# Patient Record
Sex: Male | Born: 2000 | Race: White | Hispanic: No | Marital: Single
Health system: Southern US, Community
[De-identification: ages and names within clinical notes are randomized; demographics above are authoritative.]

## PROBLEM LIST (undated history)

## (undated) DIAGNOSIS — L858 Other specified epidermal thickening: Secondary | ICD-10-CM

## (undated) DIAGNOSIS — T7840XA Allergy, unspecified, initial encounter: Secondary | ICD-10-CM

## (undated) DIAGNOSIS — L309 Dermatitis, unspecified: Secondary | ICD-10-CM

## (undated) DIAGNOSIS — J45909 Unspecified asthma, uncomplicated: Secondary | ICD-10-CM

## (undated) HISTORY — DX: Unspecified asthma, uncomplicated: J45.909

## (undated) HISTORY — DX: Dermatitis, unspecified: L30.9

## (undated) HISTORY — DX: Allergy, unspecified, initial encounter: T78.40XA

## (undated) HISTORY — DX: Other specified epidermal thickening: L85.8

---
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
## 2020-01-18 ENCOUNTER — Other Ambulatory Visit: Payer: Self-pay | Admitting: Pediatrics

## 2020-01-18 DIAGNOSIS — L700 Acne vulgaris: Secondary | ICD-10-CM

## 2020-06-13 ENCOUNTER — Other Ambulatory Visit: Payer: Medicaid Other

## 2020-06-13 ENCOUNTER — Other Ambulatory Visit: Payer: Self-pay

## 2020-06-13 DIAGNOSIS — Z20822 Contact with and (suspected) exposure to covid-19: Secondary | ICD-10-CM | POA: Diagnosis not present

## 2020-06-14 LAB — NOVEL CORONAVIRUS, NAA: SARS-CoV-2, NAA: NOT DETECTED

## 2020-06-14 LAB — SPECIMEN STATUS REPORT

## 2020-06-14 LAB — SARS-COV-2, NAA 2 DAY TAT

## 2020-11-21 IMAGING — DX DG CHEST 2V
2 series · 2 of 2 positions shown · non-contrast
Comparison: Chest radiograph 08/10/2005

CLINICAL DATA: COUGH

EXAM:
CHEST - 2 VIEW

[chest pa]
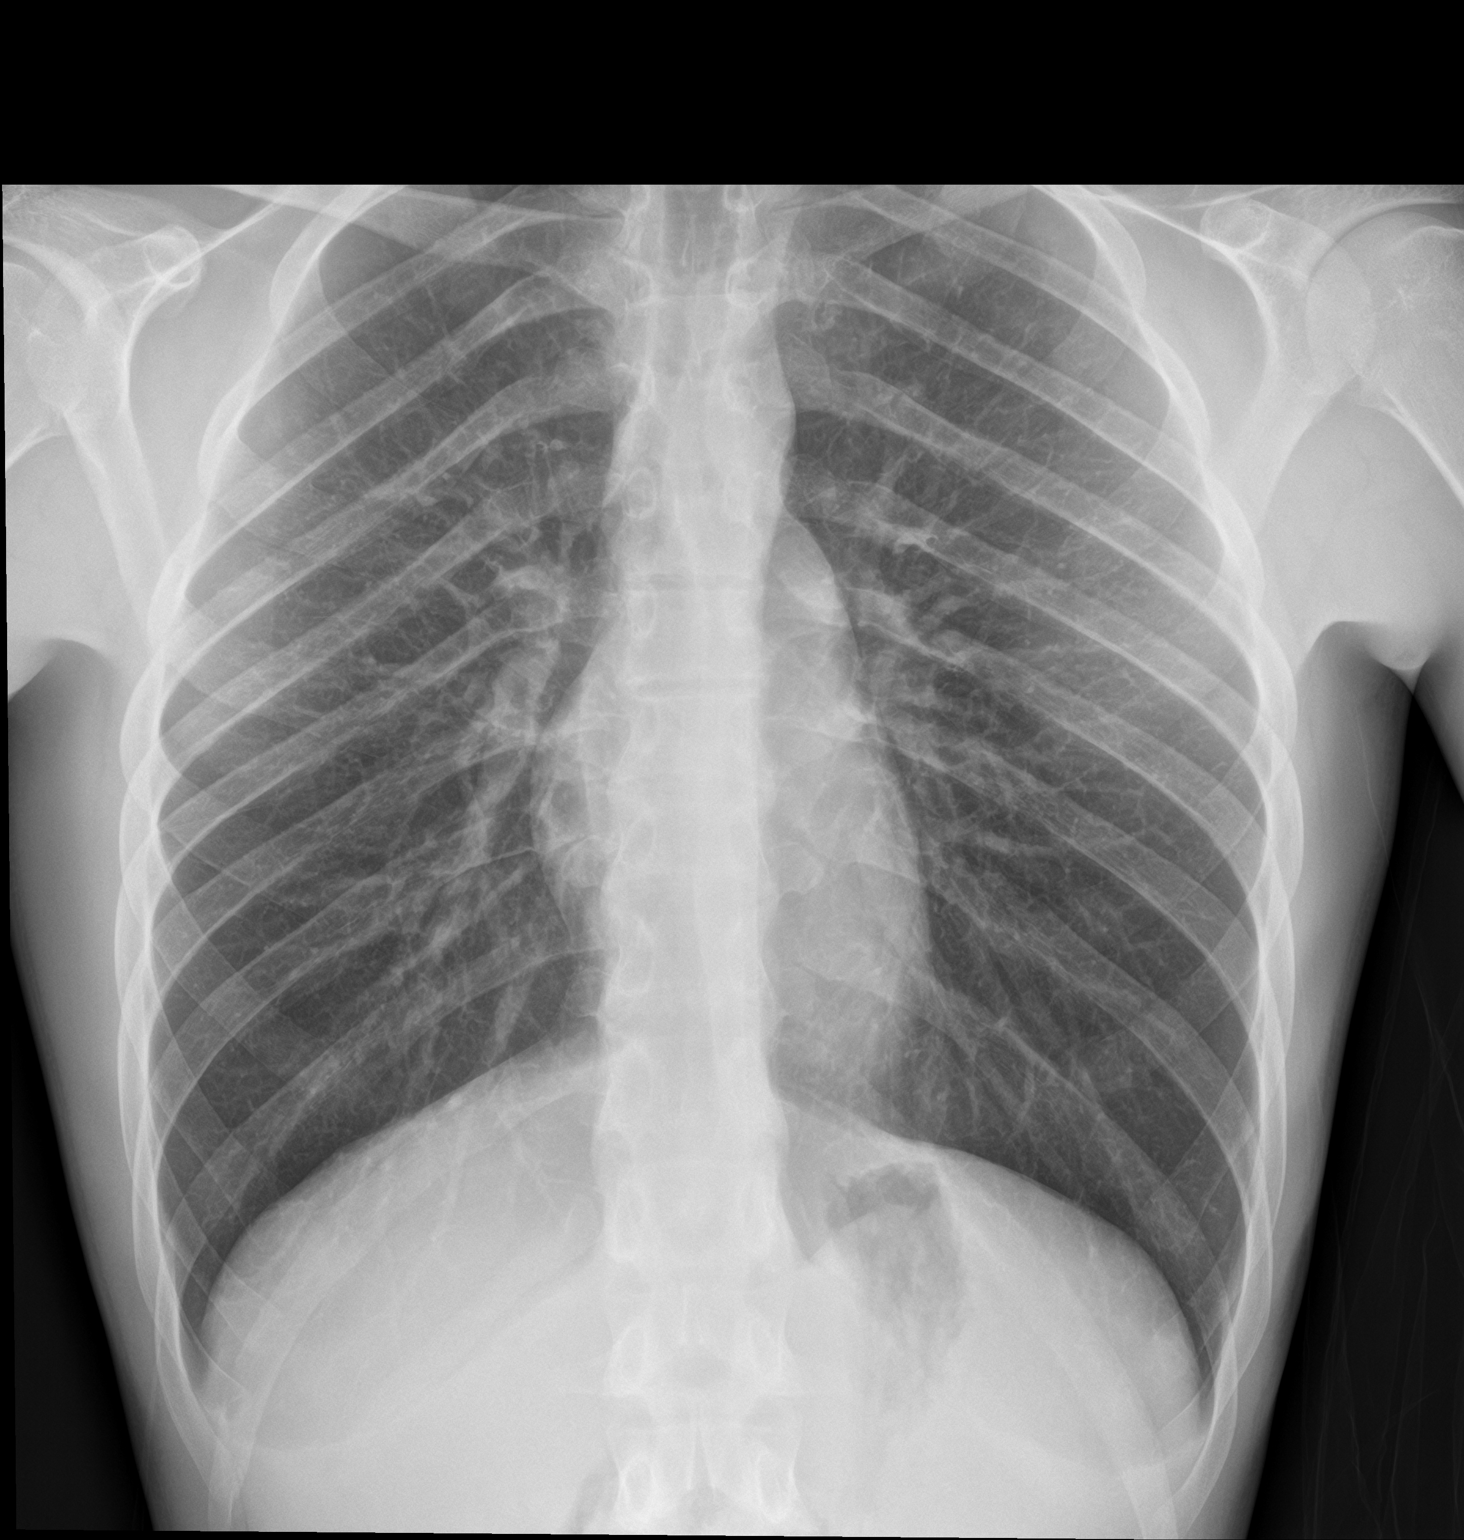

[chest lat]
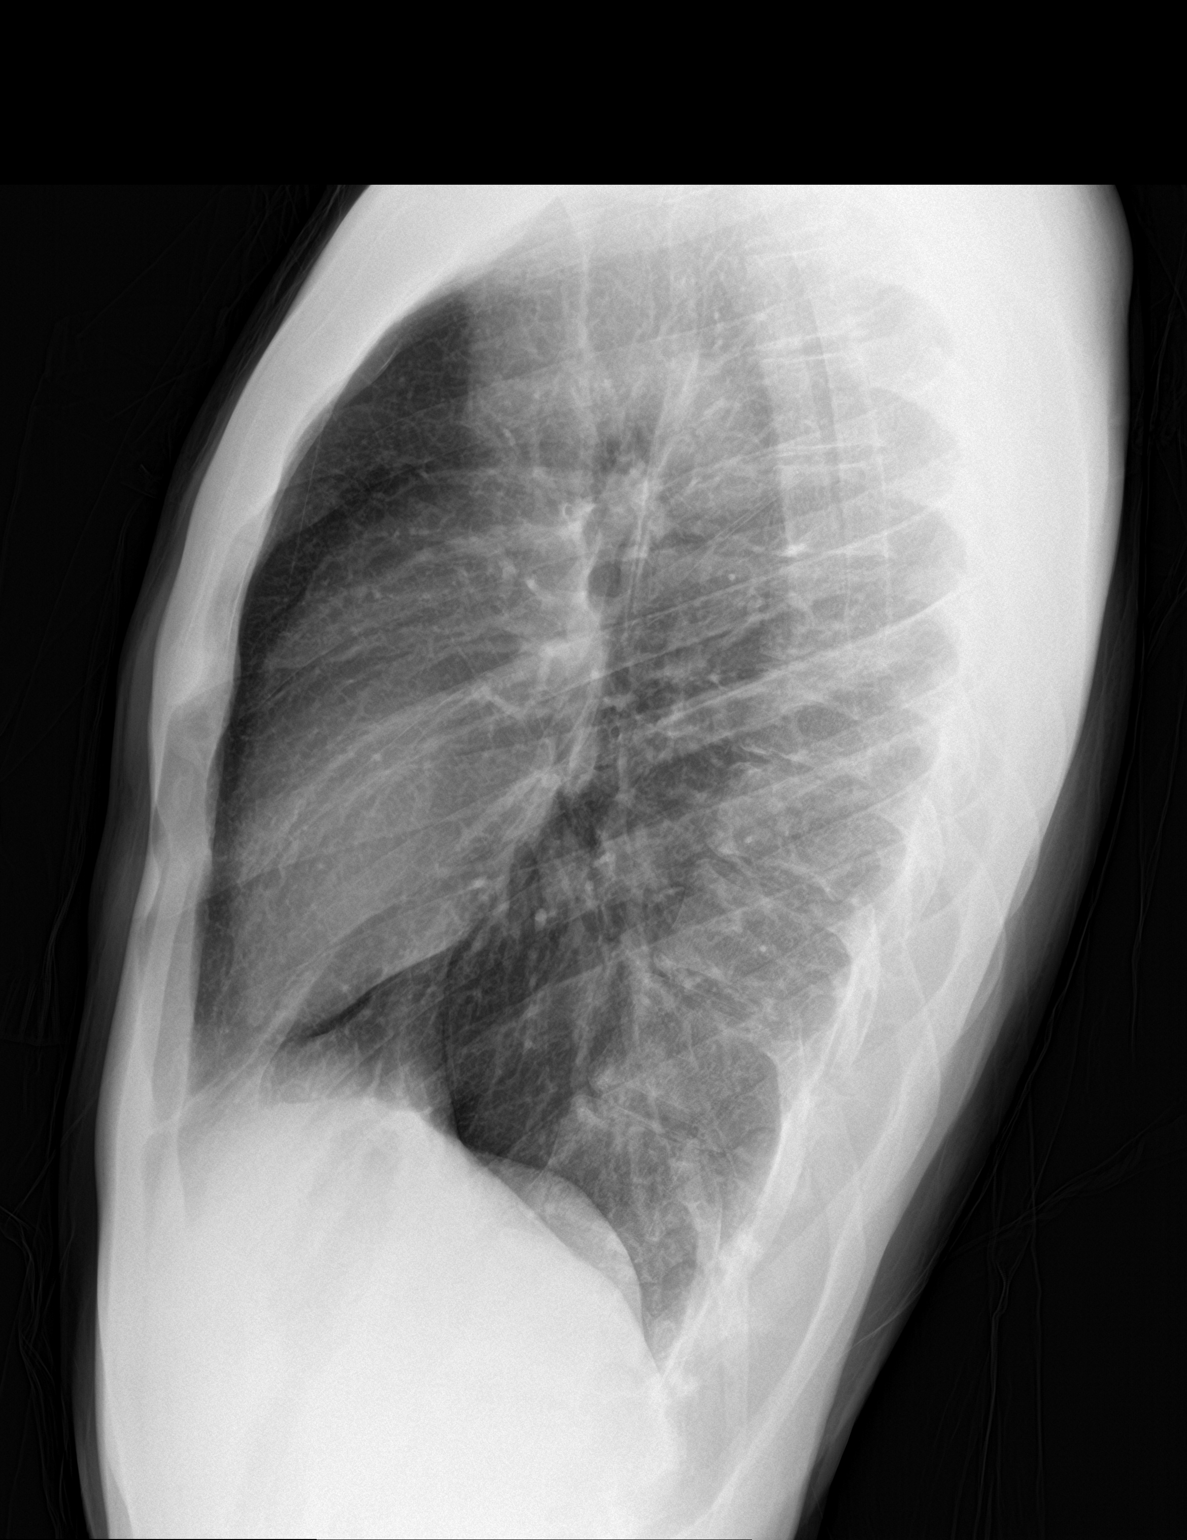

[2 of 2 positions shown; findings below may reference images not displayed]

FINDINGS: The heart size and mediastinal contours are within normal limits.
The lungs are clear. No pneumothorax or pleural effusion. The
visualized skeletal structures are unremarkable.
IMPRESSION: No evidence of active disease in the chest.

## 2021-03-04 ENCOUNTER — Encounter: Payer: Self-pay | Admitting: Pediatrics

## 2021-12-26 ENCOUNTER — Encounter: Payer: Self-pay | Admitting: *Deleted

## 2022-05-19 DIAGNOSIS — R059 Cough, unspecified: Secondary | ICD-10-CM | POA: Diagnosis not present

## 2022-05-19 DIAGNOSIS — J029 Acute pharyngitis, unspecified: Secondary | ICD-10-CM | POA: Diagnosis not present

## 2022-05-19 DIAGNOSIS — Z20822 Contact with and (suspected) exposure to covid-19: Secondary | ICD-10-CM | POA: Diagnosis not present

## 2022-08-22 DIAGNOSIS — J40 Bronchitis, not specified as acute or chronic: Secondary | ICD-10-CM | POA: Diagnosis not present

## 2022-08-22 DIAGNOSIS — Z20822 Contact with and (suspected) exposure to covid-19: Secondary | ICD-10-CM | POA: Diagnosis not present

## 2023-10-05 DIAGNOSIS — K529 Noninfective gastroenteritis and colitis, unspecified: Secondary | ICD-10-CM | POA: Diagnosis not present

## 2023-10-05 DIAGNOSIS — E860 Dehydration: Secondary | ICD-10-CM | POA: Diagnosis not present

## 2023-10-05 DIAGNOSIS — Z20822 Contact with and (suspected) exposure to covid-19: Secondary | ICD-10-CM | POA: Diagnosis not present

## 2023-10-07 ENCOUNTER — Ambulatory Visit
Admission: EM | Admit: 2023-10-07 | Discharge: 2023-10-07 | Disposition: A | Discharge status: Home / Self Care | Payer: Medicaid Other

## 2023-10-07 DIAGNOSIS — R197 Diarrhea, unspecified: Secondary | ICD-10-CM

## 2023-10-07 DIAGNOSIS — R112 Nausea with vomiting, unspecified: Secondary | ICD-10-CM | POA: Diagnosis not present

## 2023-10-07 NOTE — ED Provider Notes (Signed)
[facility]    CSN: [id number] Arrival date & time: 10/07/23  1353      History   Chief Complaint Chief Complaint  Patient presents with   Emesis    HPI Gerald Beltran is a 23 y.o. male.   Presenting today following up on emergency department visit from 3 days ago for nausea vomiting and diarrhea.  He states he presented for the symptoms and was given fluids and Zofran which significantly helped with his symptoms.  Trying to feel much better and has not needed any Zofran since last night, no vomiting or diarrhea today and tolerating p.o.  Still feeling nauseous and a bit fatigued but overall feeling much better.  Denies fever, chills, melena, hematemesis, upper respiratory symptoms, new foods or medications or recent travel.  Requesting out of work note for today.    Past Medical History:  Diagnosis Date   Allergy    Eczema    Keratosis pilaris    Unspecified asthma(493.90) 11/23/2013    Patient Active Problem List   Diagnosis Date Noted   Acne vulgaris 08/27/2018   Allergic rhinitis 11/08/2014   Eczema    Keratosis pilaris    Mild persistent asthma 11/23/2013    History reviewed. No pertinent surgical history.     Home Medications    Prior to Admission medications   Medication Sig Start Date End Date Taking? Authorizing Provider  ondansetron (ZOFRAN-ODT) 4 MG disintegrating tablet Take by mouth. 10/05/23 10/12/23 Yes [provider]  benzonatate (TESSALON) 100 MG capsule Take 1 capsule (100 mg total) by mouth every 8 (eight) hours. Patient not taking: Reported on 07/28/2019 06/17/19   Wurst, [place], PA-C  clindamycin-benzoyl peroxide (BENZACLIN) gel APPLY TO ACNE AT NIGHT AFTER WASHING FACE CAN USE UP TO TWICE DAILY IF NOT DRYING FACE OUT. 01/19/20   Rosiland Oz, MD  ibuprofen (ADVIL) 200 MG tablet Take 200 mg by mouth every 6 (six) hours as needed for fever.    [provider]  naproxen (NAPROSYN) 500 MG tablet Take 1 tablet  (500 mg total) by mouth 2 (two) times daily with a meal. 07/28/19   Darreld Mclean, MD  cetirizine (ZYRTEC) 10 MG tablet Take 1 tablet (10 mg total) by mouth daily. 05/07/16 06/17/19  McDonell, Alfredia Client, MD  fluticasone (FLONASE) 50 MCG/ACT nasal spray Place 2 sprays into both nostrils 2 (two) times daily. 05/07/16 06/17/19  McDonell, Alfredia Client, MD    Family History Family History  Problem Relation Age of Onset   Hypertension Mother        borderline   Hypertension Father    Leukemia Maternal Grandmother    Diabetes Maternal Grandfather    Heart disease Maternal Grandfather    Diabetes Paternal Grandmother    Heart disease Paternal Grandmother    Heart disease Paternal Grandfather     Social History Social History   Tobacco Use   Smoking status: Passive Smoke Exposure - Never Smoker   Smokeless tobacco: Never  Substance Use Topics   Alcohol use: Never   Drug use: Never     Allergies   Patient has no known allergies.   Review of Systems Review of Systems Per HPI  Physical Exam Triage Vital Signs ED Triage Vitals [10/07/23 1534]  Encounter Vitals Group     BP 127/67     Systolic BP Percentile      Diastolic BP Percentile      Pulse Rate 85     Resp 16  Temp 98.2 F (36.8 C)     Temp Source Oral     SpO2 99 %     Weight      Height      Head Circumference      Peak Flow      Pain Score 0     Pain Loc      Pain Education      Exclude from Growth Chart    No data found.  Updated Vital Signs BP 127/67 (BP Location: Right Arm)   Pulse 85   Temp 98.2 F (36.8 C) (Oral)   Resp 16   SpO2 99%   Visual Acuity Right Eye Distance:   Left Eye Distance:   Bilateral Distance:    Right Eye Near:   Left Eye Near:    Bilateral Near:     Physical Exam Vitals and nursing note reviewed.  Constitutional:      Appearance: Normal appearance.  HENT:     Head: Atraumatic.  Eyes:     Extraocular Movements: Extraocular movements intact.     Conjunctiva/sclera:  Conjunctivae normal.  Cardiovascular:     Rate and Rhythm: Normal rate and regular rhythm.  Pulmonary:     Effort: Pulmonary effort is normal.     Breath sounds: Normal breath sounds.  Abdominal:     General: Bowel sounds are normal. There is no distension.     Palpations: Abdomen is soft.     Tenderness: There is no abdominal tenderness. There is no guarding.  Musculoskeletal:        General: Normal range of motion.     Cervical back: Normal range of motion and neck supple.  Skin:    General: Skin is warm and dry.  Neurological:     General: No focal deficit present.     Mental Status: He is oriented to person, place, and time.  Psychiatric:        Mood and Affect: Mood normal.        Thought Content: Thought content normal.        Judgment: Judgment normal.      UC Treatments / Results  Labs (all labs ordered are listed, but only abnormal results are displayed) Labs Reviewed - No data to display  EKG   Radiology No results found.  Procedures Procedures (including critical care time)  Medications Ordered in UC Medications - No data to display  Initial Impression / Assessment and Plan / UC Course  I have reviewed the triage vital signs and the nursing notes.  Pertinent labs & imaging results that were available during my care of the patient were reviewed by me and considered in my medical decision making (see chart for details).     Vitals and exam reassuring today without red flag findings.  Feeling much better, continue Zofran as needed, brat diet, fluids.  Work note given.  Return for worsening symptoms.  Final Clinical Impressions(s) / UC Diagnoses   Final diagnoses:  Nausea vomiting and diarrhea   Discharge Instructions   None    ED Prescriptions   None    PDMP not reviewed this encounter.   Particia Nearing, [place] 10/07/23 1615

## 2023-10-07 NOTE — ED Triage Notes (Signed)
Pt states he was vomiting all  day Sunday states he went to the ED Monday morning and was given fluids and zofran.  Pt states he is still feeling nauseas weak and sometimes he feels dizzy.  States he has not vomited since Monday and has started to try and eat and drink.

## 2024-05-13 ENCOUNTER — Encounter: Payer: Self-pay | Admitting: *Deleted
